# Patient Record
Sex: Male | Born: 1935 | Race: White | Hispanic: No | Marital: Married
Health system: Southern US, Community
[De-identification: ages and names within clinical notes are randomized; demographics above are authoritative.]

---

## 2010-12-04 ENCOUNTER — Inpatient Hospital Stay: Payer: Self-pay | Admitting: Internal Medicine

## 2010-12-22 ENCOUNTER — Ambulatory Visit: Payer: Self-pay | Admitting: Vascular Surgery

## 2011-01-07 ENCOUNTER — Inpatient Hospital Stay: Payer: Self-pay | Admitting: Internal Medicine

## 2011-01-13 LAB — CBC WITH DIFFERENTIAL/PLATELET
Basophil #: 0 10*3/uL (ref 0.0–0.1)
Basophil %: 0.2 %
Eosinophil #: 0.3 10*3/uL (ref 0.0–0.7)
Eosinophil %: 2.9 %
HCT: 37.1 % — ABNORMAL LOW (ref 40.0–52.0)
HGB: 12.5 g/dL — ABNORMAL LOW (ref 13.0–18.0)
Lymphocyte #: 2.1 10*3/uL (ref 1.0–3.6)
Lymphocyte %: 21 %
MCH: 33 pg (ref 26.0–34.0)
MCHC: 33.7 g/dL (ref 32.0–36.0)
MCV: 98 fL (ref 80–100)
Monocyte #: 1.2 10*3/uL — ABNORMAL HIGH (ref 0.0–0.7)
Monocyte %: 11.5 %
Neutrophil #: 6.5 10*3/uL (ref 1.4–6.5)
Neutrophil %: 64.4 %
Platelet: 129 10*3/uL — ABNORMAL LOW (ref 150–440)
RBC: 3.8 10*6/uL — ABNORMAL LOW (ref 4.40–5.90)
RDW: 14.1 % (ref 11.5–14.5)
WBC: 10.1 10*3/uL (ref 3.8–10.6)

## 2011-01-13 LAB — BASIC METABOLIC PANEL
Anion Gap: 10 (ref 7–16)
BUN: 25 mg/dL — ABNORMAL HIGH (ref 7–18)
Calcium, Total: 8.9 mg/dL (ref 8.5–10.1)
Chloride: 103 mmol/L (ref 98–107)
Co2: 27 mmol/L (ref 21–32)
Creatinine: 1.61 mg/dL — ABNORMAL HIGH (ref 0.60–1.30)
EGFR (African American): 53 — ABNORMAL LOW
EGFR (Non-African Amer.): 44 — ABNORMAL LOW
Glucose: 115 mg/dL — ABNORMAL HIGH (ref 65–99)
Osmolality: 285 (ref 275–301)
Potassium: 4.1 mmol/L (ref 3.5–5.1)
Sodium: 140 mmol/L (ref 136–145)

## 2011-01-13 LAB — VANCOMYCIN, TROUGH: Vancomycin, Trough: 24 ug/mL (ref 10–20)

## 2011-01-15 LAB — CBC WITH DIFFERENTIAL/PLATELET
Basophil #: 0.1 10*3/uL (ref 0.0–0.1)
Basophil %: 1.2 %
Eosinophil #: 0.2 10*3/uL (ref 0.0–0.7)
Eosinophil %: 2.8 %
HCT: 34.6 % — ABNORMAL LOW (ref 40.0–52.0)
HGB: 11.8 g/dL — ABNORMAL LOW (ref 13.0–18.0)
Lymphocyte #: 1.9 10*3/uL (ref 1.0–3.6)
Lymphocyte %: 21.7 %
MCH: 33.3 pg (ref 26.0–34.0)
MCHC: 34.1 g/dL (ref 32.0–36.0)
MCV: 98 fL (ref 80–100)
Monocyte #: 1 10*3/uL — ABNORMAL HIGH (ref 0.0–0.7)
Monocyte %: 11.8 %
Neutrophil #: 5.4 10*3/uL (ref 1.4–6.5)
Neutrophil %: 62.5 %
Platelet: 154 10*3/uL (ref 150–440)
RBC: 3.55 10*6/uL — ABNORMAL LOW (ref 4.40–5.90)
RDW: 13.8 % (ref 11.5–14.5)
WBC: 8.6 10*3/uL (ref 3.8–10.6)

## 2011-01-15 LAB — BASIC METABOLIC PANEL
Anion Gap: 10 (ref 7–16)
BUN: 17 mg/dL (ref 7–18)
Calcium, Total: 8.6 mg/dL (ref 8.5–10.1)
Chloride: 105 mmol/L (ref 98–107)
Co2: 27 mmol/L (ref 21–32)
Creatinine: 1.66 mg/dL — ABNORMAL HIGH (ref 0.60–1.30)
EGFR (African American): 51 — ABNORMAL LOW
EGFR (Non-African Amer.): 42 — ABNORMAL LOW
Glucose: 119 mg/dL — ABNORMAL HIGH (ref 65–99)
Osmolality: 286 (ref 275–301)
Potassium: 3.8 mmol/L (ref 3.5–5.1)
Sodium: 142 mmol/L (ref 136–145)

## 2011-11-13 DEATH — deceased

## 2013-03-11 IMAGING — CR DG CHEST 1V PORT
1 series · 1 of 1 positions shown · non-contrast
Comparison: none

REASON FOR EXAM: weak
COMMENTS:

[portable]
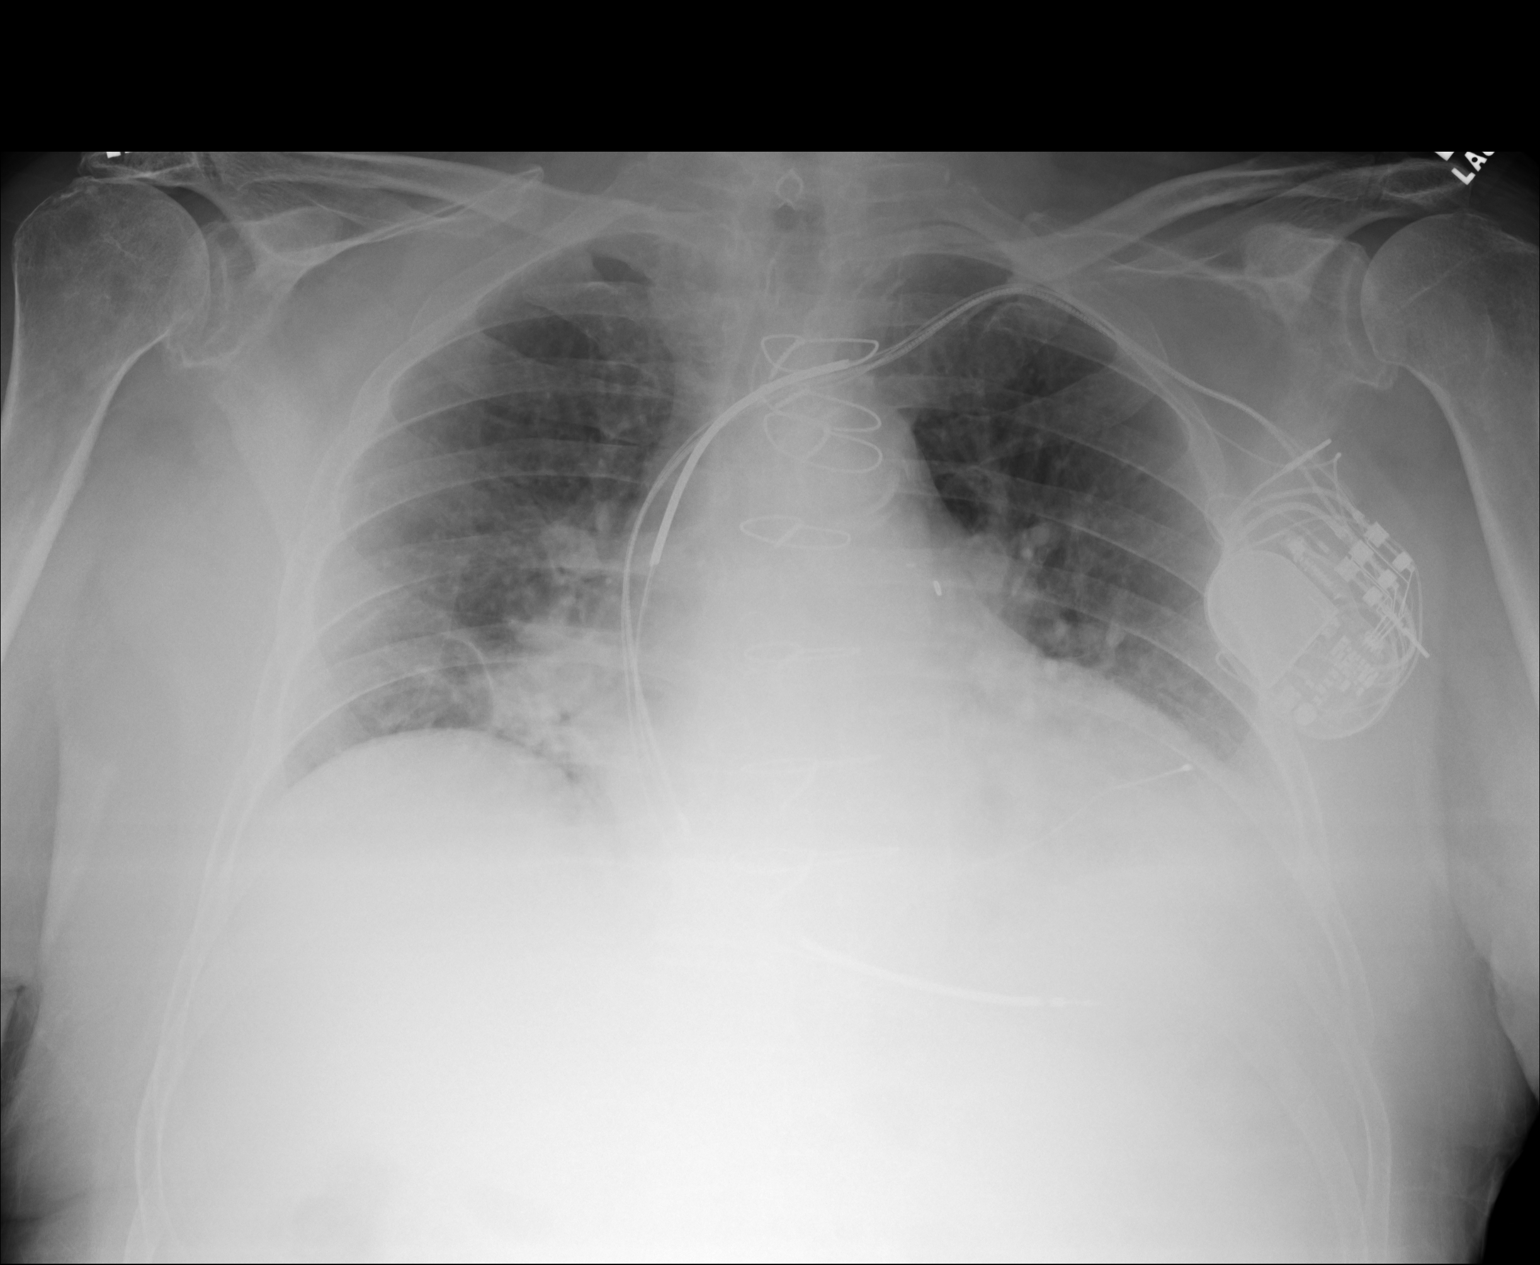

[1 of 1 positions shown; findings below may reference images not displayed]

PROCEDURE:     DXR - DXR PORTABLE CHEST SINGLE VIEW  - January 07, 2011 [DATE]

RESULT:     Comparison is made to the study 04 December, 2010.

Left-sided pacemaker device is present. The heart is mildly enlarged.
Sternotomy wires are present. Pulmonary vascular congestion is present.
There is very shallow inflation. Right lung base atelectasis is present. The
left lung base is poorly seen.
IMPRESSION: 1. Hypoinflation with cardiomegaly, right lung base atelectasis and
incomplete visualization of the left lung base. Followup PA and lateral
views are recommended.

## 2014-05-06 NOTE — Discharge Summary (Signed)
PATIENT NAME:  Evan Cruz, Ezrael W MR#:  [id number] DATE OF BIRTH:  05-Mar-1923  DATE OF ADMISSION:  01/07/2011 DATE OF DISCHARGE:  01/15/2011  HISTORY: Mr. Shelda AltesKraushaar is an 81 year old male who is known to have coronary artery disease, bypass surgery, diabetes, stroke with left-sided weakness, has been recently discharged from the hospital with transient ischemic attack symptoms to [facility]. He came back complaining of cough, generalized weakness and wheezing. The patient had a temperature of 99 and he was admitted into the hospital with a diagnosis of flu, acute bronchitis and left lower lobe pneumonia, diabetes mellitus, coronary artery disease and hypoglycemia. For the rest of the details of the history and physical, please see typed history and physical sheet.   LABORATORY DATA: Blood sugar 136, creatinine 1.33 GFR 54, WBC count 5,000, platelet count 123, PO2 63.   HOSPITAL COURSE: The patient was very lethargic and sleepy the next day. He was continued on his home medication. Blood cultures were obtained which was initially thought that they were positive for bacteremia, but it suggested contamination. He has some rales or rhonchi and also had a systolic murmur grade 2/6. Because of the patient's agitation, the patient was seen in consultation by psychiatry. His Haldol was stopped. Trazodone was stopped and extra benzodiazepine was also stopped. After a detailed discussion with family, it was noted that the patient was very irregular in taking his home medication. The psychiatrist has no other further recommendations. The patient was discharged home.   FINAL DIAGNOSES:  1. Viral pneumonia with encephalopathy, which was probably acute. 2. Chronic dementia and Alzheimer's disease.  3. Angina pectoris, stable, status post bypass surgery.  4. Chronic obstructive pulmonary disease, stable.   DISCHARGE MEDICATIONS:  1. The patient was discharged on Lantus 50 units a day. 2. Nitro-Dur 0.3  mg/hour transderm film once a day. 3. Ecotrin 325 mg p.o. daily.  4. Coreg 25 mg p.o. twice a day. 5. Vasotec 5 mg p.o. daily.  6. Timolol eyedrops as directed. 7. NovoLog insulin subcutaneous according to the scale.  8. Namenda 5 mg p.o. daily.  9. Zocor 40 mg p.o. daily.  10. Glucotrol 5 mg p.o. daily.   FOLLOW-UP: The patient will be seen in my office in about 2 to 3 weeks from discharge.    ____________________________ Corky DownsJaved Clarine Elrod, MD jm:ap D: 02/11/2011 16:18:13 ET T: 02/12/2011 10:46:31 ET JOB#: [id number]  cc: Corky DownsJaved Leilah Polimeni, MD, <Dictator> Corky DownsJAVED Bayron Dalto MD ELECTRONICALLY SIGNED 02/15/2011 21:12

## 2014-05-06 NOTE — Consult Note (Signed)
Brief Consult Note: Diagnosis: delirium due to medical condition - resolved.   Patient was seen by consultant.   Consult note dictated.   Comments: Psychiatry: Patient seen. Consult for oversedation. Patient was awake when I saw him. He was alert and oriented to place, time and situation. He says he is not aware of a problem with slepp but his family with whom he lives say he is up and down all night. Klonapin, however, has been oversedating for him.  As far as acute sedation it looks like it is resolved and he is going home. Presumably med induced. Since the haldol, trazadone and extra benzos were stopped he has recovered.  Pt sensetive to medication esp to sedating ones. If he needs anything to sleep long term I suggested trying melatonin or low dose of short acting meds like ambien. No need for further treatment now, however.  Electronic Signatures: Audery Amellapacs, Kavitha Lansdale T (MD)  (Signed 03-Jan-13 15:41)  Authored: Brief Consult Note   Last Updated: 03-Jan-13 15:41 by Audery Amellapacs, Ameria Sanjurjo T (MD)
